# Patient Record
Sex: Female | Born: 1950 | Race: White | Hispanic: No | Smoking: Former smoker
Health system: Southern US, Community
[De-identification: ages and names within clinical notes are randomized; demographics above are authoritative.]

## PROBLEM LIST (undated history)

## (undated) DIAGNOSIS — C801 Malignant (primary) neoplasm, unspecified: Secondary | ICD-10-CM

## (undated) DIAGNOSIS — R112 Nausea with vomiting, unspecified: Secondary | ICD-10-CM

## (undated) DIAGNOSIS — T8859XA Other complications of anesthesia, initial encounter: Secondary | ICD-10-CM

## (undated) DIAGNOSIS — Z9889 Other specified postprocedural states: Secondary | ICD-10-CM

## (undated) DIAGNOSIS — T4145XA Adverse effect of unspecified anesthetic, initial encounter: Secondary | ICD-10-CM

## (undated) DIAGNOSIS — E039 Hypothyroidism, unspecified: Secondary | ICD-10-CM

## (undated) DIAGNOSIS — F419 Anxiety disorder, unspecified: Secondary | ICD-10-CM

## (undated) HISTORY — PX: MANDIBLE SURGERY: SHX707

## (undated) HISTORY — PX: MELANOMA EXCISION: SHX5266

## (undated) HISTORY — PX: DERMOID CYST  EXCISION: SHX1452

## (undated) HISTORY — PX: APPENDECTOMY: SHX54

---

## 1998-03-22 ENCOUNTER — Emergency Department (HOSPITAL_COMMUNITY): Admission: EM | Admit: 1998-03-22 | Discharge: 1998-03-22 | Payer: Self-pay | Admitting: Emergency Medicine

## 1998-09-04 ENCOUNTER — Other Ambulatory Visit: Admission: RE | Admit: 1998-09-04 | Discharge: 1998-09-04 | Payer: Self-pay | Admitting: *Deleted

## 1999-10-27 ENCOUNTER — Other Ambulatory Visit: Admission: RE | Admit: 1999-10-27 | Discharge: 1999-10-27 | Payer: Self-pay | Admitting: *Deleted

## 1999-12-25 ENCOUNTER — Other Ambulatory Visit: Admission: RE | Admit: 1999-12-25 | Discharge: 1999-12-25 | Payer: Self-pay | Admitting: Internal Medicine

## 2000-11-23 ENCOUNTER — Other Ambulatory Visit: Admission: RE | Admit: 2000-11-23 | Discharge: 2000-11-23 | Payer: Self-pay | Admitting: *Deleted

## 2000-12-09 ENCOUNTER — Encounter: Payer: Self-pay | Admitting: Internal Medicine

## 2000-12-09 ENCOUNTER — Encounter: Admission: RE | Admit: 2000-12-09 | Discharge: 2000-12-09 | Payer: Self-pay | Admitting: Internal Medicine

## 2001-11-28 ENCOUNTER — Other Ambulatory Visit: Admission: RE | Admit: 2001-11-28 | Discharge: 2001-11-28 | Payer: Self-pay | Admitting: *Deleted

## 2002-12-11 ENCOUNTER — Other Ambulatory Visit: Admission: RE | Admit: 2002-12-11 | Discharge: 2002-12-11 | Payer: Self-pay | Admitting: *Deleted

## 2004-06-22 ENCOUNTER — Other Ambulatory Visit: Admission: RE | Admit: 2004-06-22 | Discharge: 2004-06-22 | Payer: Self-pay | Admitting: *Deleted

## 2004-09-04 ENCOUNTER — Encounter (INDEPENDENT_AMBULATORY_CARE_PROVIDER_SITE_OTHER): Payer: Self-pay | Admitting: *Deleted

## 2004-09-04 ENCOUNTER — Ambulatory Visit (HOSPITAL_COMMUNITY): Admission: RE | Admit: 2004-09-04 | Discharge: 2004-09-04 | Payer: Self-pay | Admitting: Gastroenterology

## 2005-07-27 ENCOUNTER — Other Ambulatory Visit: Admission: RE | Admit: 2005-07-27 | Discharge: 2005-07-27 | Payer: Self-pay | Admitting: *Deleted

## 2006-01-26 ENCOUNTER — Encounter: Admission: RE | Admit: 2006-01-26 | Discharge: 2006-01-26 | Payer: Self-pay | Admitting: Surgery

## 2006-01-28 ENCOUNTER — Ambulatory Visit (HOSPITAL_BASED_OUTPATIENT_CLINIC_OR_DEPARTMENT_OTHER): Admission: RE | Admit: 2006-01-28 | Discharge: 2006-01-28 | Payer: Self-pay | Admitting: Surgery

## 2006-01-28 ENCOUNTER — Encounter (INDEPENDENT_AMBULATORY_CARE_PROVIDER_SITE_OTHER): Payer: Self-pay | Admitting: *Deleted

## 2006-08-11 ENCOUNTER — Other Ambulatory Visit: Admission: RE | Admit: 2006-08-11 | Discharge: 2006-08-11 | Payer: Self-pay | Admitting: *Deleted

## 2006-11-14 ENCOUNTER — Encounter: Admission: RE | Admit: 2006-11-14 | Discharge: 2006-11-14 | Payer: Self-pay | Admitting: Surgery

## 2007-11-08 ENCOUNTER — Other Ambulatory Visit: Admission: RE | Admit: 2007-11-08 | Discharge: 2007-11-08 | Payer: Self-pay | Admitting: *Deleted

## 2008-02-26 IMAGING — CR DG CHEST 2V
2 series · 2 of 2 positions shown · non-contrast
Comparison: 01/26/06.

CLINICAL DATA: Followup melanoma surgery. 
 CHEST ? 2 VIEW:

[view not recorded (1 of 2)]
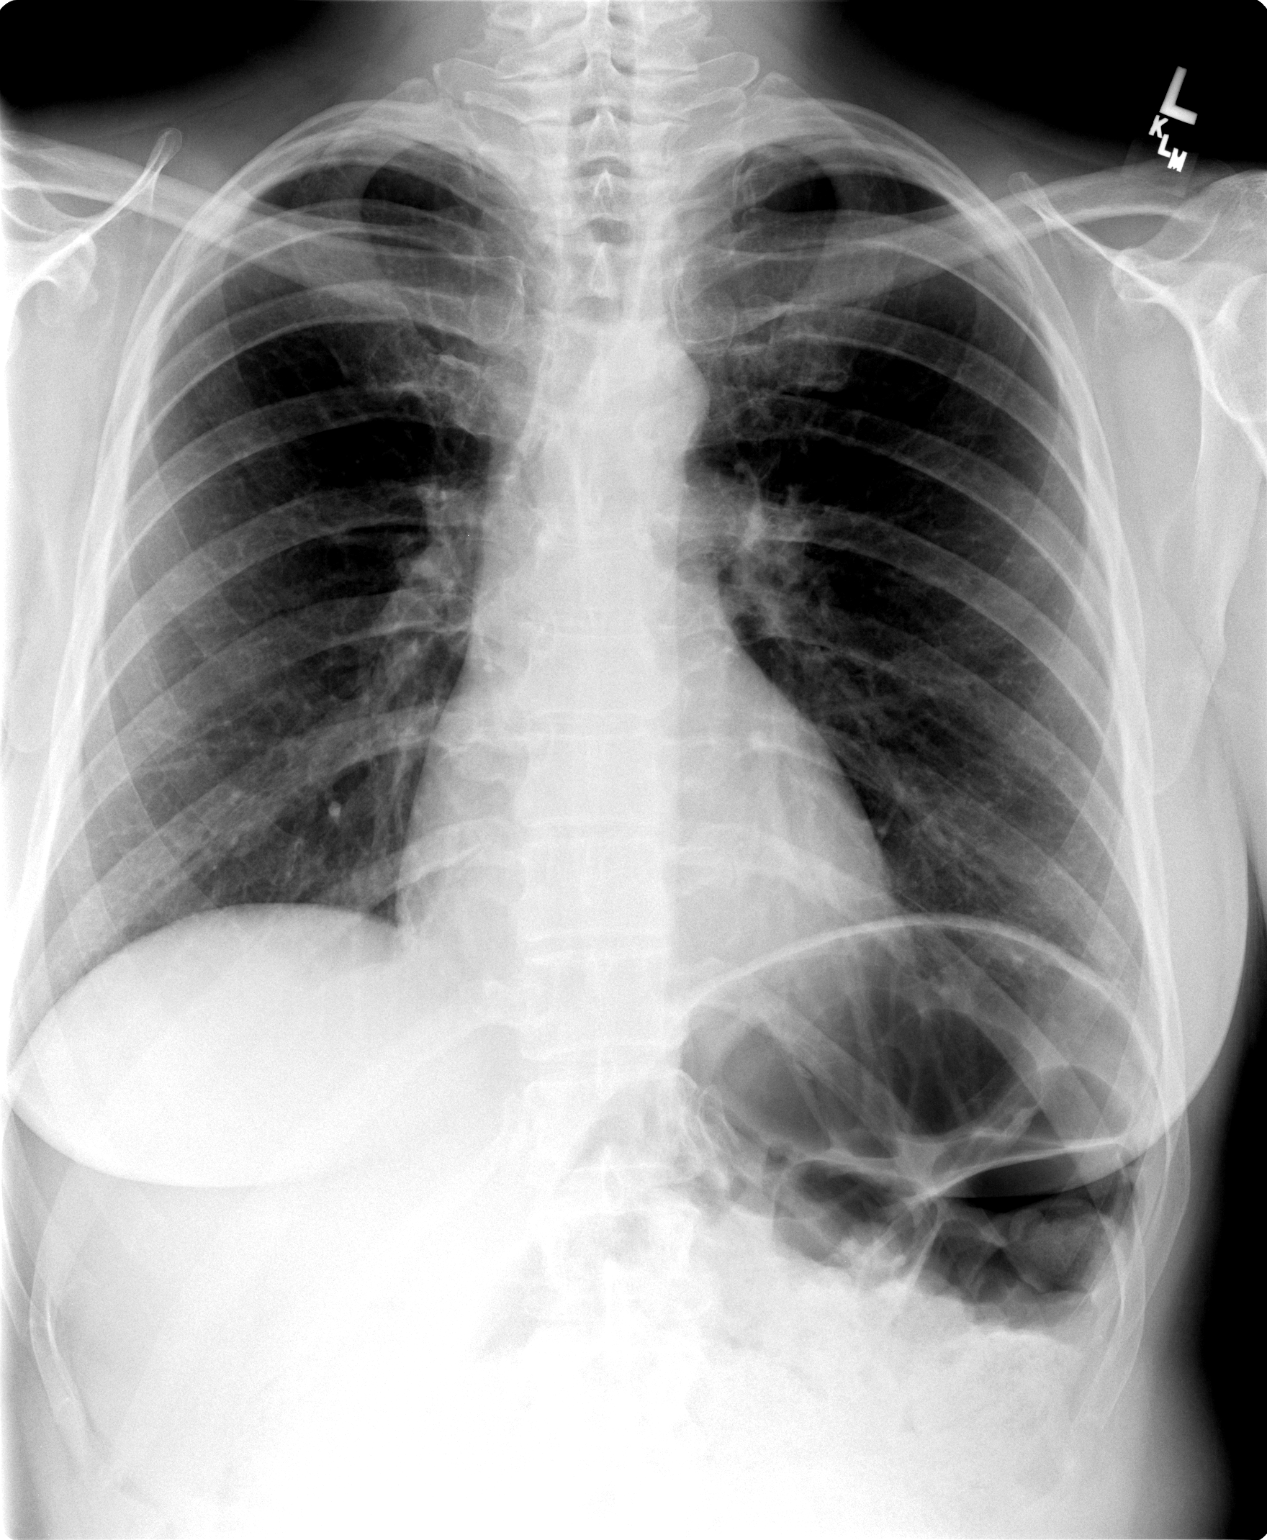

[view not recorded (2 of 2)]
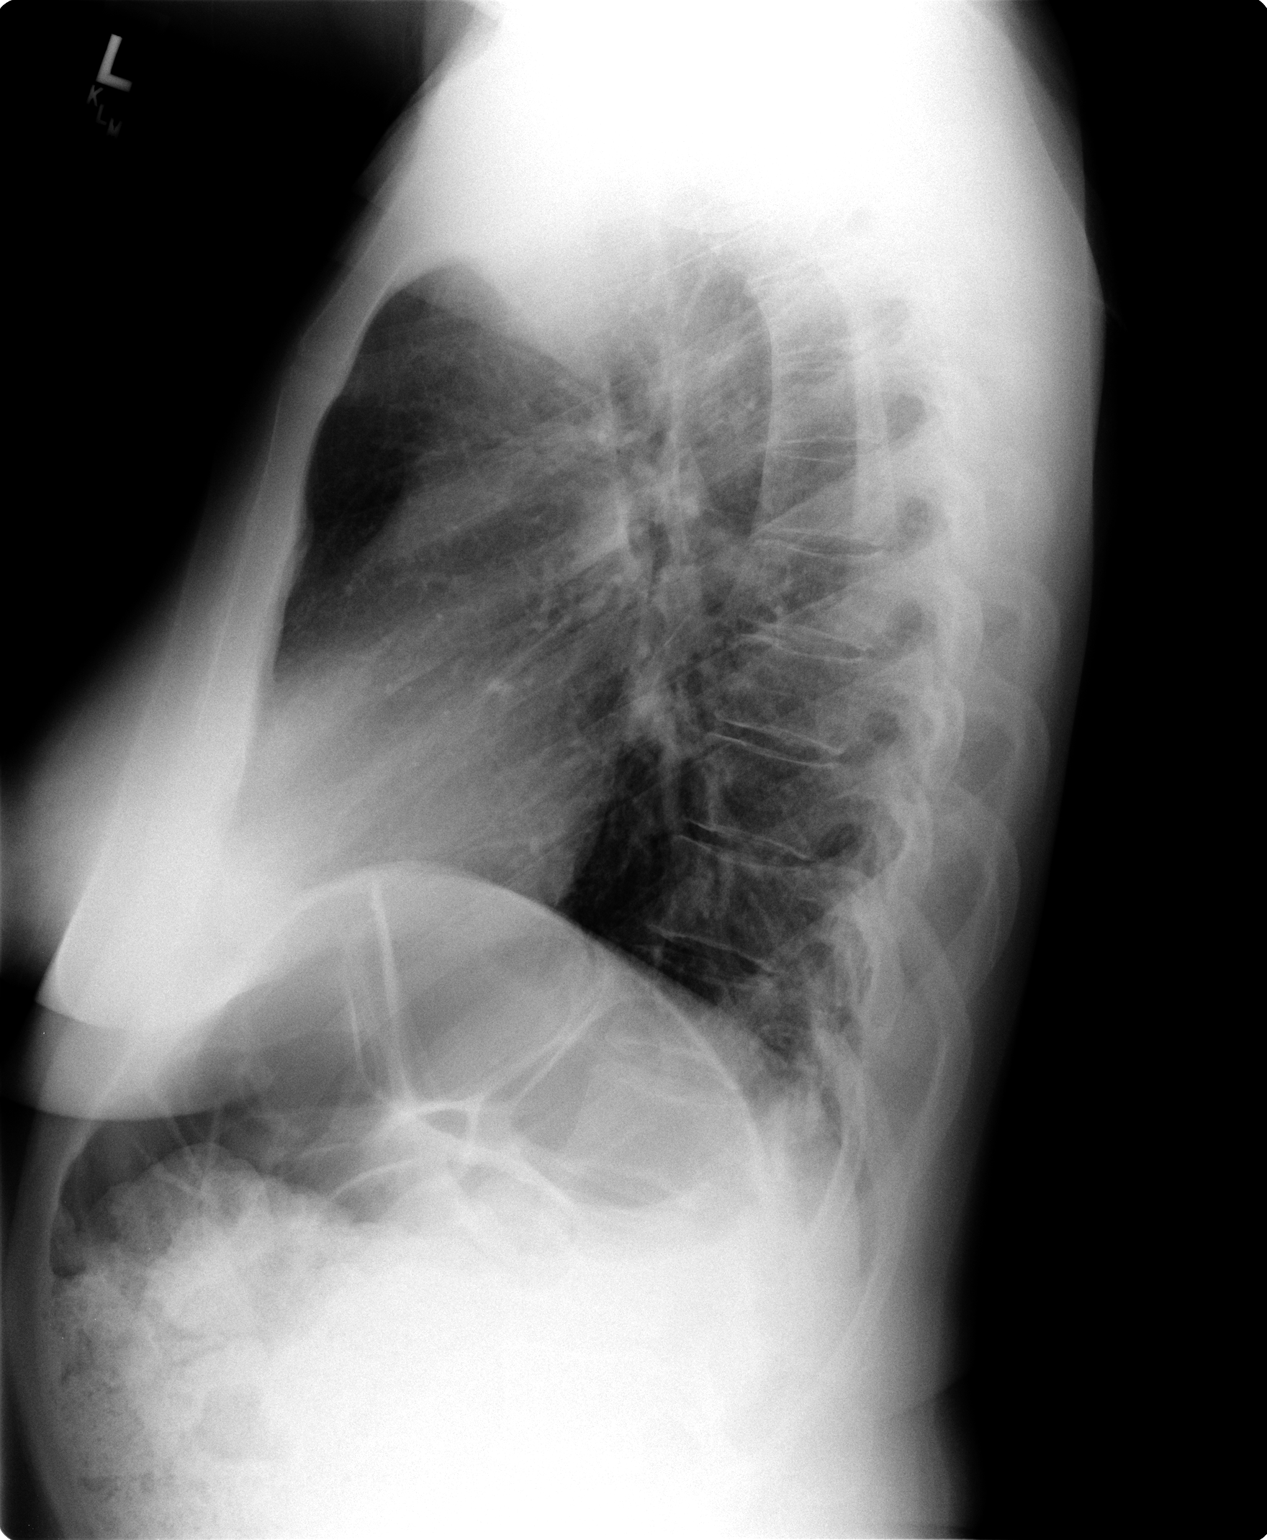

[2 of 2 positions shown; findings below may reference images not displayed]

FINDINGS: Heart size is normal.  The mediastinum is unremarkable.  The lungs are clear.  No nodules.  No effusions.  No bony abnormality.
IMPRESSION: No change.  Normal chest.

## 2011-01-07 ENCOUNTER — Other Ambulatory Visit: Payer: Self-pay | Admitting: Obstetrics and Gynecology

## 2011-01-15 NOTE — Op Note (Signed)
NAMEBRYNLEI, KLAUSNER                ACCOUNT NO.:  [id number]   MEDICAL RECORD NO.:  [id number]          PATIENT TYPE:  AMB   LOCATION:  DSC                          FACILITY:  [facility]   PHYSICIAN:  Thomas A. Cornett, M.D.DATE OF BIRTH:  Mar 01, 1951   DATE OF PROCEDURE:  01/28/2006  DATE OF DISCHARGE:                                 OPERATIVE REPORT   PREOPERATIVE DIAGNOSIS:  Right leg melanoma.   POSTOPERATIVE DIAGNOSIS:  Right leg melanoma.   PROCEDURE:  1.  Wide excision of right leg melanoma with 1 cm margin.  2.  Sentinel lymph node mapping with injection of methylene blue dye.   SURGEON:  Dr. Harriette Bouillon.   ANESTHESIA:  MAC with 0.5% Sensorcaine local.   ESTIMATED BLOOD LOSS:  30 mL.   SPECIMEN:  1.  Two sentinel lymph nodes hot and blue, one from the superficial femoral      node region on the right, two is from the iliac chain region on the      right. Both sent to pathology for evaluation.  2.  Melanoma biopsy site right thigh approximately 8 cm above the patella      sent to pathology as well.   INDICATIONS FOR PROCEDURE:  The patient is a 60 year old female that 1 month  ago underwent a punch biopsy of a suspicious skin lesion just above her  right knee.  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  She presented to me for wide excision.  I  discussed this with her.  I also discussed the roles of lymph node mapping  in melanoma and told her that probably it would be a very low yield  procedure in her setting.  She understood this prior procedure but was  interested is still having it done in this setting.  I explained the risk of  it to her but she was very concerned about the possibility of metastatic  disease which I stated would be very low in this setting in most cases and  that the biopsy probably would be of low yield. In spite of this, she wished  to have it done in the same setting for peace of mind.  I  explained the risk  of procedure to her as well as risk of using dye injection bleeding,  infection, vessel damage, nerve damage and swelling.  She understood but  still agreed to proceed.   DESCRIPTION OF PROCEDURE:  The patient was brought to the operating room  after undergoing injection of radioactive colloid in radiology.  The right  lower extremity was prepped and draped in a sterile fashion.  A NeoProbe was  used and two hot spots were located, one in the right femoral region.  The  second was higher in the inguinal region corresponding to more than likely  the deep iliac nodal drainage system.  An incision was made first in the  right femoral region after injection of local anesthesia.  Dissection was  carried down to the superficial tissues.  I found a superficial sentinel  node which was blue and hot in the vicinity of the take off of the saphenous  vein prior to it joining the femoral vessel.  This was taken and sent to  pathology when it was blue and hot.  There is no other activity in the  femoral region.  There was activity though in the iliac region.  I used an  incision in the right inguinal region.  Dissection was carried down through  the fat until I found the aponeurosis of the external oblique.  This was  opened in the direction of its fibers.  The internal oblique was identified  and the fibers were separated.  I was then able to identify the  preperitoneal space and I entered this. I used my finger to palpate the  location of the iliac vessels.  There is a hot spot lateral to this and I  was able to palpate the node itself, grab it with an Allis clamp and pull it  up into the field.  I was able to dissect this away from the deep  structures.  This was sent to pathology for evaluation as well.  It was both  blue and hot.  There is no radioactivity noted in the iliac region.  Care  was taken not to injure the iliac artery and vein.  The round ligament was  noted was  and it was preserved.  At this point in time, I reapproximated the  muscle fibers of the internal oblique.  After this was done, I identified  the ilioinguinal nerve and pushed it away from the undersurface of the  external oblique fascia.  I then closed this fascia with a running 3-0  Vicryl as well.  Of note there was excellent hemostasis in the preperitoneal  space.  The fat was then approximated using 3-0 Vicryl and 4-0 Monocryl was  then used to close the skin incisions.  Both incisions were closed with 4-0  Monocryl for the skin and Steri-Strips were applied as well as a dry  dressing.  Once this was done, the melanoma was taken care of.  A centimeter  margin was measured around it. I excised the area just above her right knee  with a centimeter margin circumferentially all the way down to the fascia of  the extensor muscle groups of the right lower extremity.  Blue dye was also  injected at the beginning of the case in this area and massaged for 5  minutes using 4 mL of methylene blue dye.  Hemostasis was excellent.  I  closed this wound with a combination of 3-0 Vicryl and 3-0 nylon sutures.  There was some tension but it looked like that the tension was minimal.  At  this point in time sterile dressings were applied.  She had palpable femoral  pulses.  She was then awoke and taken to recovery in satisfactory condition.  All final counts of sponge, needle and instruments were found be correct.      Thomas A. Cornett, M.D.  Electronically Signed     TAC/MEDQ  D:  01/28/2006  T:  01/28/2006  Job:  [id number]   cc:   Erskine Speed, M.D.  Fax: [phone]

## 2015-06-19 ENCOUNTER — Other Ambulatory Visit: Payer: Self-pay | Admitting: Gastroenterology

## 2015-09-15 ENCOUNTER — Encounter (HOSPITAL_COMMUNITY): Payer: Self-pay | Admitting: *Deleted

## 2015-09-15 NOTE — Anesthesia Preprocedure Evaluation (Addendum)
Anesthesia Evaluation  Patient identified by MRN, date of birth, ID band Patient awake    Reviewed: Allergy & Precautions, NPO status , Patient's Chart, lab work & pertinent test results  History of Anesthesia Complications (+) PONV  Airway Mallampati: II   Neck ROM: Full    Dental  (+) Teeth Intact, Dental Advisory Given   Pulmonary neg pulmonary ROS, former smoker (quit 1984),    breath sounds clear to auscultation       Cardiovascular negative cardio ROS   Rhythm:Regular     Neuro/Psych Anxiety negative neurological ROS  negative psych ROS   GI/Hepatic negative GI ROS, Neg liver ROS,   Endo/Other  negative endocrine ROSHypothyroidism   Renal/GU negative Renal ROS  negative genitourinary   Musculoskeletal negative musculoskeletal ROS (+)   Abdominal (+)  Abdomen: soft.    Peds negative pediatric ROS (+)  Hematology negative hematology ROS (+)   Anesthesia Other Findings   Reproductive/Obstetrics negative OB ROS                            Anesthesia Physical Anesthesia Plan  ASA: II  Anesthesia Plan: MAC   Post-op Pain Management:    Induction: Intravenous  Airway Management Planned: Nasal Cannula  Additional Equipment:   Intra-op Plan:   Post-operative Plan:   Informed Consent: I have reviewed the patients History and Physical, chart, labs and discussed the procedure including the risks, benefits and alternatives for the proposed anesthesia with the patient or authorized representative who has indicated his/her understanding and acceptance.     Plan Discussed with:   Anesthesia Plan Comments:         Anesthesia Quick Evaluation

## 2015-09-22 ENCOUNTER — Ambulatory Visit (HOSPITAL_COMMUNITY): Payer: 59 | Admitting: Anesthesiology

## 2015-09-22 ENCOUNTER — Encounter (HOSPITAL_COMMUNITY)
Admission: RE | Disposition: A | Discharge status: Home / Self Care | Payer: Self-pay | Source: Ambulatory Visit | Attending: Gastroenterology

## 2015-09-22 ENCOUNTER — Ambulatory Visit (HOSPITAL_COMMUNITY)
Admission: RE | Admit: 2015-09-22 | Discharge: 2015-09-22 | Disposition: A | Discharge status: Home / Self Care | Payer: 59 | Source: Ambulatory Visit | Attending: Gastroenterology | Admitting: Gastroenterology

## 2015-09-22 ENCOUNTER — Encounter (HOSPITAL_COMMUNITY): Payer: Self-pay | Admitting: *Deleted

## 2015-09-22 DIAGNOSIS — Z1211 Encounter for screening for malignant neoplasm of colon: Secondary | ICD-10-CM | POA: Diagnosis present

## 2015-09-22 DIAGNOSIS — F419 Anxiety disorder, unspecified: Secondary | ICD-10-CM | POA: Diagnosis not present

## 2015-09-22 DIAGNOSIS — Z85820 Personal history of malignant melanoma of skin: Secondary | ICD-10-CM | POA: Diagnosis not present

## 2015-09-22 DIAGNOSIS — E039 Hypothyroidism, unspecified: Secondary | ICD-10-CM | POA: Diagnosis not present

## 2015-09-22 DIAGNOSIS — Z87891 Personal history of nicotine dependence: Secondary | ICD-10-CM | POA: Diagnosis not present

## 2015-09-22 HISTORY — DX: Nausea with vomiting, unspecified: R11.2

## 2015-09-22 HISTORY — DX: Adverse effect of unspecified anesthetic, initial encounter: T41.45XA

## 2015-09-22 HISTORY — DX: Malignant (primary) neoplasm, unspecified: C80.1

## 2015-09-22 HISTORY — DX: Other complications of anesthesia, initial encounter: T88.59XA

## 2015-09-22 HISTORY — PX: COLONOSCOPY WITH PROPOFOL: SHX5780

## 2015-09-22 HISTORY — DX: Other specified postprocedural states: Z98.890

## 2015-09-22 HISTORY — DX: Hypothyroidism, unspecified: E03.9

## 2015-09-22 HISTORY — DX: Anxiety disorder, unspecified: F41.9

## 2015-09-22 SURGERY — COLONOSCOPY WITH PROPOFOL
Anesthesia: Monitor Anesthesia Care

## 2015-09-22 MED ORDER — SODIUM CHLORIDE 0.9 % IV SOLN: INTRAVENOUS | Status: DC

## 2015-09-22 MED ORDER — LACTATED RINGERS IV SOLN
INTRAVENOUS | Status: DC
  Administered 2015-09-22: 09:00:00 via INTRAVENOUS

## 2015-09-22 MED ORDER — LIDOCAINE HCL (PF) 2 % IJ SOLN
INTRAMUSCULAR | Status: DC | PRN
  Administered 2015-09-22: 20 mg via INTRADERMAL

## 2015-09-22 MED ORDER — PROPOFOL 10 MG/ML IV BOLUS
INTRAVENOUS | Status: AC
  Filled 2015-09-22: qty 20

## 2015-09-22 MED ORDER — PROMETHAZINE HCL 25 MG/ML IJ SOLN: 6.2500 mg | INTRAMUSCULAR | Status: DC | PRN

## 2015-09-22 MED ORDER — LACTATED RINGERS IV SOLN
INTRAVENOUS | Status: DC
  Administered 2015-09-22: 1000 mL via INTRAVENOUS

## 2015-09-22 MED ORDER — MEPERIDINE HCL 100 MG/ML IJ SOLN: 6.2500 mg | INTRAMUSCULAR | Status: DC | PRN

## 2015-09-22 MED ORDER — PROPOFOL 10 MG/ML IV BOLUS
INTRAVENOUS | Status: DC | PRN
  Administered 2015-09-22: 50 mg via INTRAVENOUS
  Administered 2015-09-22 (×2): 100 mg via INTRAVENOUS

## 2015-09-22 MED ORDER — FENTANYL CITRATE (PF) 100 MCG/2ML IJ SOLN: 25.0000 ug | INTRAMUSCULAR | Status: DC | PRN

## 2015-09-22 SURGICAL SUPPLY — 22 items

## 2015-09-22 NOTE — Discharge Instructions (Signed)

## 2015-09-22 NOTE — Op Note (Signed)
Procedure: Screening colonoscopy. Normal screening colonoscopy performed on 09/04/2004  Endoscopist: Earle Gell  Premedication: Propofol administered by anesthesia  Procedure: The patient was placed in the left lateral decubitus position. Anal inspection and digital rectal exam were normal. The Pentax pediatric colonoscope was introduced into the rectum and advanced to the cecum. A normal-appearing appendiceal orifice and ileocecal valve were identified. Colonic preparation for the exam today was good. Withdrawal time was 14 minutes  Rectum. Normal. Retroflexed view of the distal rectum was normal  Sigmoid colon and descending colon. Normal  Splenic flexure. Normal  Transverse colon. Normal  Hepatic flexure. Normal  Ascending colon. Normal  Cecum and ileocecal valve. Normal  Assessment: Normal screening colonoscopy  Recommendation: Schedule repeat screening colonoscopy in 10 years

## 2015-09-22 NOTE — Transfer of Care (Signed)
Immediate Anesthesia Transfer of Care Note  Patient: Barbara Townsend  Procedure(s) Performed: Procedure(s): COLONOSCOPY WITH PROPOFOL (N/A)  Patient Location: PACU  Anesthesia Type:MAC  Level of Consciousness:  sedated, patient cooperative and responds to stimulation  Airway & Oxygen Therapy:Patient Spontanous Breathing and Patient connected to face mask oxgen  Post-op Assessment:  Report given to PACU RN and Post -op Vital signs reviewed and stable  Post vital signs:  Reviewed and stable  Last Vitals:  Filed Vitals:   09/22/15 0914  BP: 118/60  Pulse: 62  Temp: 37.3 C  Resp: 16    Complications: No apparent anesthesia complications

## 2015-09-22 NOTE — Anesthesia Postprocedure Evaluation (Signed)
Anesthesia Post Note  Patient: Barbara Townsend  Procedure(s) Performed: Procedure(s) (LRB): COLONOSCOPY WITH PROPOFOL (N/A)  Patient location during evaluation: PACU Anesthesia Type: MAC Level of consciousness: awake and alert Pain management: pain level controlled Vital Signs Assessment: post-procedure vital signs reviewed and stable Respiratory status: spontaneous breathing, nonlabored ventilation, respiratory function stable and patient connected to nasal cannula oxygen Cardiovascular status: stable and blood pressure returned to baseline Anesthetic complications: no    Last Vitals:  Filed Vitals:   09/22/15 0914 09/22/15 1043  BP: 118/60 101/59  Pulse: 62 55  Temp: 37.3 C 36.6 C  Resp: 16 16    Last Pain: There were no vitals filed for this visit.               Simranjit Thayer

## 2015-09-22 NOTE — H&P (Signed)
  Procedure: Screening colonoscopy. Normal screening colonoscopy performed on 09/04/2004  History: The patient is a 65 year old female born 07-Oct-1950. She is scheduled to undergo a repeat screening colonoscopy today.  Past medical history: Hypothyroidism. Melanoma surgery.  Exam: The patient is alert and lying comfortably on the endoscopy stretcher. Abdomen is soft and nontender to palpation. Lungs are clear to auscultation. Cardiac exam reveals a rhythm.  Plan: Proceed with screening colonoscopy

## 2015-09-23 ENCOUNTER — Encounter (HOSPITAL_COMMUNITY): Payer: Self-pay | Admitting: Gastroenterology

## 2016-02-05 ENCOUNTER — Other Ambulatory Visit: Payer: Self-pay | Admitting: Internal Medicine

## 2016-02-16 ENCOUNTER — Other Ambulatory Visit: Payer: Self-pay | Admitting: Internal Medicine

## 2016-02-16 DIAGNOSIS — R921 Mammographic calcification found on diagnostic imaging of breast: Secondary | ICD-10-CM

## 2016-02-23 ENCOUNTER — Other Ambulatory Visit: Payer: Self-pay | Admitting: Internal Medicine

## 2016-02-23 ENCOUNTER — Ambulatory Visit
Admission: RE | Admit: 2016-02-23 | Discharge: 2016-02-23 | Disposition: A | Discharge status: Home / Self Care | Payer: 59 | Source: Ambulatory Visit | Attending: Internal Medicine | Admitting: Internal Medicine

## 2016-02-23 DIAGNOSIS — R921 Mammographic calcification found on diagnostic imaging of breast: Secondary | ICD-10-CM

## 2018-08-06 LAB — GLUCOSE, POCT (MANUAL RESULT ENTRY): POC Glucose: 76 mg/dl (ref 70–99)

## 2023-03-01 ENCOUNTER — Other Ambulatory Visit: Payer: Self-pay

## 2023-03-01 ENCOUNTER — Other Ambulatory Visit: Payer: Self-pay | Admitting: Internal Medicine

## 2023-03-01 DIAGNOSIS — E785 Hyperlipidemia, unspecified: Secondary | ICD-10-CM

## 2023-03-24 ENCOUNTER — Other Ambulatory Visit: Payer: 59

## 2023-04-13 ENCOUNTER — Ambulatory Visit
Admission: RE | Admit: 2023-04-13 | Discharge: 2023-04-13 | Disposition: A | Discharge status: Home / Self Care | Payer: No Typology Code available for payment source | Source: Ambulatory Visit | Attending: Internal Medicine

## 2023-04-13 DIAGNOSIS — E785 Hyperlipidemia, unspecified: Secondary | ICD-10-CM
